# Patient Record
Sex: Male | Born: 2000 | Race: White | Hispanic: No | Marital: Single | Smoking: Never smoker
Health system: Southern US, Community
[De-identification: ages and names within clinical notes are randomized; demographics above are authoritative.]

## PROBLEM LIST (undated history)

## (undated) DIAGNOSIS — F909 Attention-deficit hyperactivity disorder, unspecified type: Secondary | ICD-10-CM

## (undated) HISTORY — DX: Attention-deficit hyperactivity disorder, unspecified type: F90.9

---
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
## 2020-04-05 ENCOUNTER — Other Ambulatory Visit: Payer: Self-pay

## 2020-04-05 ENCOUNTER — Ambulatory Visit: Payer: Self-pay

## 2020-04-05 DIAGNOSIS — Z23 Encounter for immunization: Secondary | ICD-10-CM

## 2020-04-20 ENCOUNTER — Other Ambulatory Visit: Payer: 59

## 2020-10-12 ENCOUNTER — Other Ambulatory Visit: Payer: Self-pay

## 2020-10-12 NOTE — Progress Notes (Signed)
Presents to [facility] for on-site pre-employment drug screen.  [facility] Acct #:  [id number] [facility] Specimen #:  [id number]  Rapid Drug Screen Results = Negative  AMD

## 2020-11-24 ENCOUNTER — Ambulatory Visit: Payer: Self-pay | Admitting: Physician Assistant

## 2020-11-24 ENCOUNTER — Encounter: Payer: Self-pay | Admitting: Physician Assistant

## 2020-11-24 ENCOUNTER — Other Ambulatory Visit: Payer: Self-pay

## 2020-11-24 VITALS — BP 137/86 | HR 92 | Temp 97.6°F | Resp 14 | Ht 69.0 in | Wt 210.0 lb

## 2020-11-24 DIAGNOSIS — B079 Viral wart, unspecified: Secondary | ICD-10-CM

## 2020-11-24 NOTE — Progress Notes (Signed)
Pt needs 2nd opinion of wart on right knuckle. Pt denies any pain.

## 2020-11-24 NOTE — Progress Notes (Signed)
[facility] Emergency Department Provider Note   ____________________________________________   None    (approximate)  I have reviewed the triage vital signs and the nursing notes.   HISTORY  Chief Complaint Warts (Pt has wart on right knuckle. Pt mom did froze it and the 10th day will be the 16. Pt wondering if theres anything else to do for it. Treated Saturday 11/19/20 Cl,RMA)    HPI Craig Espinoza is a 20 y.o. male patient presents for wart on the dorsal aspect of his right hand.  Patient says mother use over-the-counter freezing technique approximately 6 days ago.  Patient concerned because this not complete resolution he will be going to school next week.  Denies loss sensation or loss of function.  No other palliative measure for complaint.         Past Medical History:  Diagnosis Date   ADHD    20 years old    There are no problems to display for this patient.   Past Surgical History:  Procedure Laterality Date   NOSE SURGERY  2011    Prior to Admission medications   Medication Sig Start Date End Date Taking? Authorizing Provider  albuterol (VENTOLIN HFA) 108 (90 Base) MCG/ACT inhaler INHALE 2 PUFFS INTO THE LUNGS EVERY 4 HOURS AS NEEDED FOR WHEEZING 06/15/13  Yes [provider]  Azelastine-Fluticasone 137-50 MCG/ACT SUSP  09/23/13  Yes [provider]  budesonide (PULMICORT FLEXHALER) 180 MCG/ACT inhaler INHALE 1 INHALATION INTO THE LUNGS TWICEDAILY 05/22/18  Yes [provider]  dexmethylphenidate (FOCALIN) 5 MG tablet Take 1 tablet by mouth every evening. 11/22/20  Yes [provider]  FOCALIN XR 30 MG CP24 Take 1 capsule by mouth daily. 10/27/20  Yes [provider]  INTUNIV 4 MG TB24 ER tablet Take 4 mg by mouth daily. 11/22/20  Yes [provider]  Multiple Vitamin (DAILY VITAMINS) tablet Take 1 tablet by mouth daily.   Yes [provider]  ranitidine (ZANTAC) 150 MG tablet Take by  mouth.   Yes [provider]    Allergies Penicillins  Family History  Problem Relation Age of Onset   Hodgkin's lymphoma Father     Social History Social History   Tobacco Use   Smoking status: Never   Smokeless tobacco: Never  Vaping Use   Vaping Use: Never used  Substance Use Topics   Alcohol use: Never   Drug use: Never    Review of Systems Constitutional: No fever/chills Eyes: No visual changes. ENT: No sore throat. Cardiovascular: Denies chest pain. Respiratory: Denies shortness of breath. Gastrointestinal: No abdominal pain.  No nausea, no vomiting.  No diarrhea.  No constipation. Genitourinary: Negative for dysuria. Musculoskeletal: Negative for back pain. Skin: Positive for rash. Neurological: Negative for headaches, focal weakness or numbness. Psychiatric: ADHD Allergic/Immunilogical: Penicillin ____________________________________________   PHYSICAL EXAM:  VITAL SIGNS: Constitutional: Alert and oriented. Well appearing and in no acute distress. Cardiovascular: Normal rate, regular rhythm. Grossly normal heart sounds.  Good peripheral circulation. Respiratory: Normal respiratory effort.  No retractions. Lungs CTAB. Musculoskeletal: No lower extremity tenderness nor edema.  No joint effusions. Neurologic:  Normal speech and language. No gross focal neurologic deficits are appreciated. No gait instability. Skin:  Skin is warm, dry and intact.  Papular lesion dorsal aspect of right hand consistent with wart.   Psychiatric: Mood and affect are normal. Speech and behavior are normal.  ____________________________________________   LABS (all labs ordered are listed, but only abnormal results  are displayed)   ____________________________________________  EKG   ____________________________________________  RADIOLOGY I, Joni Reining, personally viewed and evaluated these images (plain radiographs) as part of my medical decision making, as  well as reviewing the written report by the radiologist.  ED MD interpretation:    Official radiology report(s): No results found.  ____________________________________________   PROCEDURES  Procedure(s) performed (including Critical Care):  Procedures   ____________________________________________   INITIAL IMPRESSION / ASSESSMENT AND PLAN / ED COURSE  As part of my medical decision making, I reviewed the following data within the electronic MEDICAL RECORD NUMBERPatient present patient presents for evaluation of a wart to the dorsal aspect of right hand.  Patient is amendable to another cryotherapy session using rapid freeze.  Patient given discharge care instruction advised follow-up in 1 week.            ____________________________________________   FINAL CLINICAL IMPRESSION(S) / ED DIAGNOSES  Wart right hand   ED Discharge Orders     None        Note:  This document was prepared using Dragon voice recognition software and may include unintentional dictation errors.

## 2022-11-12 ENCOUNTER — Encounter: Payer: Self-pay | Admitting: Physician Assistant

## 2022-11-12 ENCOUNTER — Ambulatory Visit: Payer: Self-pay | Admitting: Physician Assistant

## 2022-11-12 VITALS — BP 126/81 | HR 80 | Temp 97.7°F | Resp 14 | Ht 69.0 in | Wt 220.0 lb

## 2022-11-12 DIAGNOSIS — B079 Viral wart, unspecified: Secondary | ICD-10-CM

## 2022-11-12 NOTE — Progress Notes (Signed)
   Subjective: Lower lip lesion    Patient ID: Craig Espinoza, male    DOB: 11-28-2000, 22 y.o.   MRN: [id number]  HPI Patient complain of left lower lip lesion for approximately 2 weeks.  Patient states that this counseled his procedure secondary to lesion.  Patient states he contacted [facility]   Review of Systems Negative except for complaint    Objective:   Physical Exam Patient has a papular lesion left lower lip consistent with a wart.       Assessment & Plan: [facility] dermatology.

## 2022-11-12 NOTE — Addendum Note (Signed)
Addended by: Gardner Candle on: 11/12/2022 01:33 PM   Modules accepted: Orders

## 2022-11-12 NOTE — Progress Notes (Signed)
Weston said he called [facility] to try to schedule an appointment, but was informed by their staff that they are presently not accepting new patients.  Referral faxed to [facility] at [phone].  AMD

## 2022-11-12 NOTE — Progress Notes (Signed)
Pt presents today with possible wart or cold sore on lip for about 2.5 weeks. Pt has tried cold sore medication but did nothing to the size. It did get bigger in stead of smaller. Dentist canceled apt due to lesion on lip
